# Patient Record
Sex: Female | Born: 1990
Health system: Southern US, Community
[De-identification: ages and names within clinical notes are randomized; demographics above are authoritative.]

## PROBLEM LIST (undated history)

## (undated) DIAGNOSIS — K859 Acute pancreatitis without necrosis or infection, unspecified: Secondary | ICD-10-CM

## (undated) DIAGNOSIS — E079 Disorder of thyroid, unspecified: Secondary | ICD-10-CM

## (undated) HISTORY — PX: TONSILLECTOMY: SUR1361

---

## 2013-10-28 ENCOUNTER — Emergency Department (HOSPITAL_COMMUNITY): Payer: Medicaid - Out of State

## 2013-10-28 ENCOUNTER — Encounter (HOSPITAL_COMMUNITY): Payer: Self-pay | Admitting: Emergency Medicine

## 2013-10-28 ENCOUNTER — Emergency Department (HOSPITAL_COMMUNITY)
Admission: EM | Admit: 2013-10-28 | Discharge: 2013-10-28 | Disposition: A | Discharge status: Home / Self Care | Payer: Medicaid - Out of State | Attending: Emergency Medicine | Admitting: Emergency Medicine

## 2013-10-28 DIAGNOSIS — Z3202 Encounter for pregnancy test, result negative: Secondary | ICD-10-CM | POA: Insufficient documentation

## 2013-10-28 DIAGNOSIS — Z862 Personal history of diseases of the blood and blood-forming organs and certain disorders involving the immune mechanism: Secondary | ICD-10-CM | POA: Insufficient documentation

## 2013-10-28 DIAGNOSIS — Z8719 Personal history of other diseases of the digestive system: Secondary | ICD-10-CM | POA: Insufficient documentation

## 2013-10-28 DIAGNOSIS — Z8639 Personal history of other endocrine, nutritional and metabolic disease: Secondary | ICD-10-CM | POA: Insufficient documentation

## 2013-10-28 DIAGNOSIS — R109 Unspecified abdominal pain: Secondary | ICD-10-CM

## 2013-10-28 DIAGNOSIS — R197 Diarrhea, unspecified: Secondary | ICD-10-CM | POA: Insufficient documentation

## 2013-10-28 DIAGNOSIS — N898 Other specified noninflammatory disorders of vagina: Secondary | ICD-10-CM | POA: Insufficient documentation

## 2013-10-28 DIAGNOSIS — R1084 Generalized abdominal pain: Secondary | ICD-10-CM | POA: Insufficient documentation

## 2013-10-28 DIAGNOSIS — R112 Nausea with vomiting, unspecified: Secondary | ICD-10-CM | POA: Insufficient documentation

## 2013-10-28 HISTORY — DX: Disorder of thyroid, unspecified: E07.9

## 2013-10-28 HISTORY — DX: Acute pancreatitis without necrosis or infection, unspecified: K85.90

## 2013-10-28 LAB — URINALYSIS, ROUTINE W REFLEX MICROSCOPIC
Bilirubin Urine: NEGATIVE
Glucose, UA: NEGATIVE mg/dL
HGB URINE DIPSTICK: NEGATIVE
Ketones, ur: NEGATIVE mg/dL
LEUKOCYTES UA: NEGATIVE
Nitrite: NEGATIVE
PROTEIN: NEGATIVE mg/dL
Specific Gravity, Urine: 1.027 (ref 1.005–1.030)
UROBILINOGEN UA: 0.2 mg/dL (ref 0.0–1.0)
pH: 7 (ref 5.0–8.0)

## 2013-10-28 LAB — COMPREHENSIVE METABOLIC PANEL
ALBUMIN: 4.2 g/dL (ref 3.5–5.2)
ALT: 11 U/L (ref 0–35)
AST: 13 U/L (ref 0–37)
Alkaline Phosphatase: 72 U/L (ref 39–117)
BUN: 13 mg/dL (ref 6–23)
CALCIUM: 9.6 mg/dL (ref 8.4–10.5)
CO2: 23 meq/L (ref 19–32)
Chloride: 103 mEq/L (ref 96–112)
Creatinine, Ser: 0.81 mg/dL (ref 0.50–1.10)
GFR calc Af Amer: >90 mL/min (ref >90)
Glucose, Bld: 101 mg/dL — ABNORMAL HIGH (ref 70–99)
Potassium: 4.2 mEq/L (ref 3.7–5.3)
Sodium: 140 mEq/L (ref 137–147)
Total Bilirubin: 0.3 mg/dL (ref 0.3–1.2)
Total Protein: 8.2 g/dL (ref 6.0–8.3)

## 2013-10-28 LAB — WET PREP, GENITAL
Clue Cells Wet Prep HPF POC: NONE SEEN
Trich, Wet Prep: NONE SEEN
Yeast Wet Prep HPF POC: NONE SEEN

## 2013-10-28 LAB — I-STAT TROPONIN, ED: TROPONIN I, POC: 0 ng/mL (ref 0.00–0.08)

## 2013-10-28 LAB — POC URINE PREG, ED: Preg Test, Ur: NEGATIVE

## 2013-10-28 LAB — CBC
HCT: 41.5 % (ref 36.0–46.0)
Hemoglobin: 14.1 g/dL (ref 12.0–15.0)
MCH: 29.8 pg (ref 26.0–34.0)
MCHC: 34 g/dL (ref 30.0–36.0)
MCV: 87.7 fL (ref 78.0–100.0)
Platelets: 236 10*3/uL (ref 150–400)
RBC: 4.73 MIL/uL (ref 3.87–5.11)
RDW: 13.2 % (ref 11.5–15.5)
WBC: 10.5 10*3/uL (ref 4.0–10.5)

## 2013-10-28 LAB — LIPASE, BLOOD: LIPASE: 74 U/L — AB (ref 11–59)

## 2013-10-28 MED ORDER — IOHEXOL 300 MG/ML  SOLN
20.0000 mL | INTRAMUSCULAR | Status: AC
  Administered 2013-10-28: 20 mL via ORAL

## 2013-10-28 MED ORDER — MORPHINE SULFATE 4 MG/ML IJ SOLN
4.0000 mg | Freq: Once | INTRAMUSCULAR | Status: AC
  Administered 2013-10-28: 4 mg via INTRAVENOUS
  Filled 2013-10-28: qty 1

## 2013-10-28 MED ORDER — OMEPRAZOLE 20 MG PO CPDR: 20.0000 mg | DELAYED_RELEASE_CAPSULE | Freq: Every day | ORAL | Status: DC

## 2013-10-28 MED ORDER — GI COCKTAIL ~~LOC~~: 30.0000 mL | Freq: Once | ORAL | Status: DC

## 2013-10-28 MED ORDER — IOHEXOL 300 MG/ML  SOLN
80.0000 mL | Freq: Once | INTRAMUSCULAR | Status: AC | PRN
  Administered 2013-10-28: 80 mL via INTRAVENOUS

## 2013-10-28 MED ORDER — HYDROCODONE-ACETAMINOPHEN 5-325 MG PO TABS: 1.0000 | ORAL_TABLET | ORAL | Status: DC | PRN

## 2013-10-28 NOTE — ED Notes (Signed)
Gi cocktail not given.   Order not seen in time

## 2013-10-28 NOTE — ED Notes (Signed)
The pt has not heard from her xrays.  She is waiting for a disposition

## 2013-10-28 NOTE — ED Notes (Signed)
Pt reports "moving chest pains" x 3 days. Started in mid chest, moved to back, then left side and now right side. Reports its sharp shooting pain. Feels its similar to pancreatitis pain in past.

## 2013-10-28 NOTE — ED Provider Notes (Signed)
CSN: [id number]     Arrival date & time 10/28/13  1305 History   First MD Initiated Contact with Patient 10/28/13 1521     Chief Complaint  Patient presents with  . Chest Pain  . Back Pain     (Consider location/radiation/quality/duration/timing/severity/associated sxs/prior Treatment) Patient is a [age] y.o. female presenting with chest pain.  Chest Pain Pain location:  Substernal area and epigastric Radiates to: neck, left hip. Pain severity:  Severe Onset quality:  Gradual Duration:  3 days Timing:  Constant Progression:  Worsening Chronicity:  New Relieved by:  Nothing Worsened by:  Nothing tried (not worse with food) Associated symptoms: nausea and vomiting (x 1)   Associated symptoms: no shortness of breath   Associated symptoms comment:  Loose stools x 3 today   Past Medical History  Diagnosis Date  . Pancreatitis   . Thyroid disease    History reviewed. No pertinent past surgical history. History reviewed. No pertinent family history. History  Substance Use Topics  . Smoking status: Current Every Day Smoker    Types: Cigarettes  . Smokeless tobacco: Not on file  . Alcohol Use: No   OB History   Grav Para Term Preterm Abortions TAB SAB Ect Mult Living                 Review of Systems  Respiratory: Negative for shortness of breath.   Cardiovascular: Positive for chest pain.  Gastrointestinal: Positive for nausea and vomiting (x 1).  Genitourinary: Negative for menstrual problem (LMP now).  All other systems reviewed and are negative.     Allergies  Review of patient's allergies indicates no known allergies.  Home Medications   Prior to Admission medications   Medication Sig Start Date End Date Taking? Authorizing Provider  Acetaminophen-Caff-Pyrilamine (MIDOL COMPLETE) 500-60-15 MG TABS Take 1 capsule by mouth daily as needed (for cramping).   Yes Historical Provider, MD   BP 105/66  Pulse 93  Temp(Src) 99.3 F (37.4 C) (Oral)  Resp 22  SpO2  100%  LMP 10/28/2013 Physical Exam  Nursing note and vitals reviewed. Constitutional: She is oriented to person, place, and time. She appears well-developed and well-nourished. No distress.  HENT:  Head: Normocephalic and atraumatic.  Mouth/Throat: Oropharynx is clear and moist.  Eyes: Conjunctivae are normal. Pupils are equal, round, and reactive to light. No scleral icterus.  Neck: Neck supple.  Cardiovascular: Normal rate, regular rhythm, normal heart sounds and intact distal pulses.   No murmur heard. Pulmonary/Chest: Effort normal and breath sounds normal. No stridor. No respiratory distress. She has no rales. She exhibits tenderness (sternal palpation).  Abdominal: Soft. Bowel sounds are normal. She exhibits no distension. There is generalized tenderness (worse in lower abdomen). There is CVA tenderness (mild). There is no rigidity, no rebound and no guarding.  Genitourinary: Uterus is tender. Uterus is not deviated. Cervix exhibits no motion tenderness, no discharge and no friability. Right adnexum displays tenderness. Right adnexum displays no mass. Left adnexum displays tenderness. Left adnexum displays no mass. There is bleeding around the vagina. No tenderness around the vagina. No vaginal discharge found.  nonfocal lower abdominal and pelvic tenderness  Musculoskeletal: Normal range of motion.  Neurological: She is alert and oriented to person, place, and time.  Skin: Skin is warm and dry. No rash noted.  Psychiatric: She has a normal mood and affect. Her behavior is normal.    ED Course  Procedures (including critical care time) Labs Review Labs Reviewed  COMPREHENSIVE METABOLIC  PANEL - Abnormal; Notable for the following:    Glucose, Bld 101 (*)    All other components within normal limits  LIPASE, BLOOD - Abnormal; Notable for the following:    Lipase 74 (*)    All other components within normal limits  CBC  URINALYSIS, ROUTINE W REFLEX MICROSCOPIC  I-STAT TROPOININ,  ED  POC URINE PREG, ED    Imaging Review Dg Chest 2 View  10/28/2013   CLINICAL DATA:  Substernal chest pain radiating to the back  EXAM: CHEST  2 VIEW  COMPARISON:  None.  FINDINGS: The heart size and mediastinal contours are within normal limits. Both lungs are clear. The visualized skeletal structures are unremarkable.  IMPRESSION: No active cardiopulmonary disease.   Electronically Signed   By: Christiana Pellant M.D.   On: 10/28/2013 16:36   Ct Abdomen Pelvis W Contrast  10/28/2013   CLINICAL DATA:  Abdominal pain with nausea and vomiting  EXAM: CT ABDOMEN AND PELVIS WITH CONTRAST  TECHNIQUE: Multidetector CT imaging of the abdomen and pelvis was performed using the standard protocol following bolus administration of intravenous contrast.  CONTRAST:  80mL OMNIPAQUE IOHEXOL 300 MG/ML SOLN intravenously ; the patient also received oral contrast material  COMPARISON:  None.  FINDINGS: The liver exhibits no focal mass nor ductal dilation. The gallbladder is adequately distended. There are no calcified gallstones. The pancreas, spleen, adrenal glands, and kidneys are normal in appearance. The caliber of the abdominal aorta is normal. The periaortic and pericaval regions are normal in appearance.  The stomach is partially distended with the oral contrast. Contrast has traversed the duodenum and jejunum and reached the ileum. There is subjective mild irregularity and thickening of the wall of the transverse or third portion of the duodenum. A definite ulcer is not demonstrated. No diverticulum is demonstrated. Contrast has not yet reached the colon. There is a structure consistent with a normal calibered, uninflamed-appearing appendix on images 42 through 51 of series 201. The stool burden within the colon is within the limits of normal. There is a moderate amount of colonic gas. There are no findings to suggest colitis.  The partially distended urinary bladder is normal in appearance. A tampon is present within  the vagina. The uterus exhibits no acute abnormality. No adnexal masses are demonstrated. There is no free abdominal or pelvic fluid. There is no inguinal nor umbilical hernia. The psoas musculature is normal in appearance.  The lumbar spine and bony pelvis exhibit no acute abnormalities. The lung bases are clear.  IMPRESSION: 1. There is no evidence of bowel obstruction or ileus. Irregularity of the wall of the third portion of the duodenum could reflect duodenitis. There is no abnormality of the jejunum, ileum, or colon. There is no evidence of acute appendicitis. 2. There is no evidence of acute hepatobiliary abnormality. No gallstones are demonstrated. 3. There is no acute urinary tract or gynecological abnormality demonstrated.   Electronically Signed   By: David  [place]   On: 10/28/2013 20:18  All radiology studies independently viewed by me.      EKG Interpretation   Date/Time:  Saturday Oct 28 2013 13:11:31 EDT Ventricular Rate:  92 PR Interval:  122 QRS Duration: 78 QT Interval:  332 QTC Calculation: 410 R Axis:   81 Text Interpretation:  Normal sinus rhythm with sinus arrhythmia Septal  infarct , age undetermined Abnormal ECG No old tracing to compare  Confirmed by [facility]  MD, TREY ([id number]) on 10/28/2013 3:32:45 PM  MDM   Final diagnoses:  Abdominal pain    23 yo female with 3 days of chest and abdominal pain.  Vomiting x 1 and loose stools x 3.  Well appearing.  Abd soft but diffusely tender, worse in lower abdomen. Pain improved with morphine and subsequently her symptoms seemed to localize further to her abdomen (as opposed to chest).  CT obtained and demonstrated mild duodenitis.  Symptoms most likely secondary to gastritis (especially given concurrent chest pain), advised treatment with prilosec.  Lower abdominal tenderness likely secondary to menstrual cycle.  Clinical picture not consistent with ovarian torsion, PID, TOA, ACS, PE, or dissection.  Plan dc home with PPI and  PCP follow up.  Return precautions given.     Candyce ChurnJohn David Edmund Holcomb III, MD 10/29/13 [phone]

## 2013-10-28 NOTE — ED Notes (Signed)
The pt returned from c-t.  Alert no distress 

## 2013-10-28 NOTE — ED Notes (Signed)
Pt  Alert  Skin warm and dry.  Family at the bedside

## 2013-10-28 NOTE — ED Notes (Signed)
To ct

## 2013-10-28 NOTE — Discharge Instructions (Signed)
Abdominal Pain, Women °Abdominal (stomach, pelvic, or belly) pain can be caused by many things. It is important to tell your doctor: °· The location of the pain. °· Does it come and go or is it present all the time? °· Are there things that start the pain (eating certain foods, exercise)? °· Are there other symptoms associated with the pain (fever, nausea, vomiting, diarrhea)? °All of this is helpful to know when trying to find the cause of the pain. °CAUSES  °· Stomach: virus or bacteria infection, or ulcer. °· Intestine: appendicitis (inflamed appendix), regional ileitis (Crohn's disease), ulcerative colitis (inflamed colon), irritable bowel syndrome, diverticulitis (inflamed diverticulum of the colon), or cancer of the stomach or intestine. °· Gallbladder disease or stones in the gallbladder. °· Kidney disease, kidney stones, or infection. °· Pancreas infection or cancer. °· Fibromyalgia (pain disorder). °· Diseases of the female organs: °· Uterus: fibroid (non-cancerous) tumors or infection. °· Fallopian tubes: infection or tubal pregnancy. °· Ovary: cysts or tumors. °· Pelvic adhesions (scar tissue). °· Endometriosis (uterus lining tissue growing in the pelvis and on the pelvic organs). °· Pelvic congestion syndrome (female organs filling up with blood just before the menstrual period). °· Pain with the menstrual period. °· Pain with ovulation (producing an egg). °· Pain with an IUD (intrauterine device, birth control) in the uterus. °· Cancer of the female organs. °· Functional pain (pain not caused by a disease, may improve without treatment). °· Psychological pain. °· Depression. °DIAGNOSIS  °Your doctor will decide the seriousness of your pain by doing an examination. °· Blood tests. °· X-rays. °· Ultrasound. °· CT scan (computed tomography, special type of X-ray). °· MRI (magnetic resonance imaging). °· Cultures, for infection. °· Barium enema (dye inserted in the large intestine, to better view it with  X-rays). °· Colonoscopy (looking in intestine with a lighted tube). °· Laparoscopy (minor surgery, looking in abdomen with a lighted tube). °· Major abdominal exploratory surgery (looking in abdomen with a large incision). °TREATMENT  °The treatment will depend on the cause of the pain.  °· Many cases can be observed and treated at home. °· Over-the-counter medicines recommended by your caregiver. °· Prescription medicine. °· Antibiotics, for infection. °· Birth control pills, for painful periods or for ovulation pain. °· Hormone treatment, for endometriosis. °· Nerve blocking injections. °· Physical therapy. °· Antidepressants. °· Counseling with a psychologist or psychiatrist. °· Minor or major surgery. °HOME CARE INSTRUCTIONS  °· Do not take laxatives, unless directed by your caregiver. °· Take over-the-counter pain medicine only if ordered by your caregiver. Do not take aspirin because it can cause an upset stomach or bleeding. °· Try a clear liquid diet (broth or water) as ordered by your caregiver. Slowly move to a bland diet, as tolerated, if the pain is related to the stomach or intestine. °· Have a thermometer and take your temperature several times a day, and record it. °· Bed rest and sleep, if it helps the pain. °· Avoid sexual intercourse, if it causes pain. °· Avoid stressful situations. °· Keep your follow-up appointments and tests, as your caregiver orders. °· If the pain does not go away with medicine or surgery, you may try: °· Acupuncture. °· Relaxation exercises (yoga, meditation). °· Group therapy. °· Counseling. °SEEK MEDICAL CARE IF:  °· You notice certain foods cause stomach pain. °· Your home care treatment is not helping your pain. °· You need stronger pain medicine. °· You want your IUD removed. °· You feel faint or   lightheaded. °· You develop nausea and vomiting. °· You develop a rash. °· You are having side effects or an allergy to your medicine. °SEEK IMMEDIATE MEDICAL CARE IF:  °· Your  pain does not go away or gets worse. °· You have a fever. °· Your pain is felt only in portions of the abdomen. The right side could possibly be appendicitis. The left lower portion of the abdomen could be colitis or diverticulitis. °· You are passing blood in your stools (bright red or black tarry stools, with or without vomiting). °· You have blood in your urine. °· You develop chills, with or without a fever. °· You pass out. °MAKE SURE YOU:  °· Understand these instructions. °· Will watch your condition. °· Will get help right away if you are not doing well or get worse. °Document Released: 03/29/2007 Document Revised: 08/24/2011 Document Reviewed: 04/18/2009 °ExitCare® Patient Information ©2014 [employer]. ° °Emergency Department Resource Guide °1) Find a Doctor and Pay Out of Pocket °Although you won't have to find out who is covered by your insurance plan, it is a good idea to ask around and get recommendations. You will then need to call the office and see if the doctor you have chosen will accept you as a new patient and what types of options they offer for patients who are self-pay. Some doctors offer discounts or will set up payment plans for their patients who do not have insurance, but you will need to ask so you aren't surprised when you get to your appointment. ° °2) Contact Your Local Health Department °Not all health departments have doctors that can see patients for sick visits, but many do, so it is worth a call to see if yours does. If you don't know where your local health department is, you can check in your phone book. The CDC also has a tool to help you locate your state's health department, and many state websites also have listings of all of their local health departments. ° °3) Find a Walk-in Clinic °If your illness is not likely to be very severe or complicated, you may want to try a walk in clinic. These are popping up all over the country in pharmacies, drugstores, and shopping  centers. They're usually staffed by nurse practitioners or physician assistants that have been trained to treat common illnesses and complaints. They're usually fairly quick and inexpensive. However, if you have serious medical issues or chronic medical problems, these are probably not your best option. ° °No Primary Care Doctor: °- Call [facility] at  832-8000 - they can help you locate a primary care doctor that  accepts your insurance, provides certain services, etc. °- Physician Referral Service- 1-800-533-3463 ° °Chronic Pain Problems: °Organization         Address  Phone   Notes  °[facility]  (336) 297-2271 Patients need to be referred by their primary care doctor.  ° °Medication Assistance: °Organization         Address  Phone   Notes  °[place] [facility] [address] °[place], [place] [address] [address] (336) 641-8030 --Must be a resident of [place] °-- Must have NO insurance coverage whatsoever (no Medicaid/ Medicare, etc.) °-- The pt. MUST have a primary care doctor that directs their care regularly and follows them in the community °  °[facility]  (866) 331-1348   °[employer]  (888) 892-1162   ° °Agencies that provide inexpensive medical care: °Organization           Address  Phone   Notes  °[facility]  (336) 832-8035   °[facility]    (336) 832-7272   °[facility] [address] °[place], [place] [address] (336) 832-4777   °[facility] [address], °[place] (336) 271-4999   °[facility]    (336) 373-0678   °[facility]    (336) 272-1050   °[facility] ° [address], [place] Phone:  (336) 832-4444, Fax:  (336) 832-4440 Hours of Operation:  9 am - 6 pm, M-F.  Also accepts Medicaid/Medicare and self-pay.  °[facility] ° [address], [place] Phone: (336) 832-3150, Fax: (336)  832-3151. Hours of Operation:  8:30 am - 5:30 pm, M-F.  Also accepts Medicaid and self-pay.  °[facility] [place] [address], [place] Phone: (336) 878-6027   °[facility] [address], [place], [place] (336)723-1848, Ext. 123 Mondays & Thursdays: 7-9 AM.  First 15 patients are seen on a first come, first serve basis. °  ° °Medicaid-accepting [place] Providers: ° °Organization         Address  Phone   Notes  °[facility] [address], [place] (336) 641-2100 Also accepts self-pay patients.  °[facility] [address], [place] ° (336) 856-9996   °[facility] [address], [place] (336) 288-8857   °[facility] [address] [place] [address], [place] (336) 299-7000   °Veita Bland [address], [place]  ° (336) [phone] Only accepts [place] Access Medicaid patients after they have their name applied to their card.  ° °Self-Pay (no insurance) in [place]: ° °Organization         Address  Phone   Notes  °Sickle Cell Patients, [facility] [address], [place] (336) 832-1970   °[facility] [address], [place] (336) 832-4400   °[facility] [place] [facility] ° [address], [place] (336) 992-4800   °[facility]/Dr. Osei-Bonsu ° [address] [place] [address], [place] or [address], [place] (336) [phone] Phone number for both [place] and [place] locations is the same.  °[facility] [address], [place] (336) 299-0000   °[facility] [address] [place] [address], [place] or [address] (336) 852-7530 °(336) 878-2260   °[facility] [address], [place] (336) 350-1642, phone; (336) 294-5005, fax Sees patients 1st and 3rd Saturday of every month.  Must not qualify for public or private insurance (i.e.  Medicaid, Medicare, [place] Health Choice, Veterans' Benefits) • Household income should be no more than 200% of the poverty level •The clinic cannot treat you if you are pregnant or think you are pregnant • Sexually transmitted diseases are not treated at the clinic.  ° ° °Dental Care: °Organization         Address  Phone  Notes  °[facility] [address],  (336) 641-6152 Accepts children up to age 21 who are enrolled in Medicaid or [place] Health Choice; pregnant women with a Medicaid card; and children who have applied for Medicaid or [place] Health Choice, but were declined, whose parents can pay a reduced fee at time   of service.  °[facility] [place]  [address], [place] (336) 641-7733 Accepts children up to age 21 who are enrolled in Medicaid or [place] Health Choice; pregnant women with a Medicaid card; and children who have applied for Medicaid or [place] Health Choice, but were declined, whose parents can pay a reduced fee at time of service.  °[facility] ° [address], [place] (336) 641-4533 Patients are seen by appointment only. Walk-ins are not accepted. [facility] will see patients 18 years of age and older. °Monday - Tuesday (8am-5pm) °Most Wednesdays (8:30-5pm) °$30 per visit, cash only  °[facility] ° [address], [place] (336) 641-4533 Patients are seen by appointment only. Walk-ins are not accepted. [facility] will see patients 18 years of age and older. °One Wednesday Evening (Monthly: Volunteer Based).  $30 per visit, cash only  °[facility]  (919) [phone] for adults; Children under age 4, call Graduate Pediatric Dentistry at ([phone]) [phone]. Children aged 4-14, please call ([phone]) [phone] to request a pediatric application. ° Dental services are provided in all areas of dental care including fillings,  crowns and bridges, complete and partial dentures, implants, gum treatment, root canals, and extractions. Preventive care is also provided. Treatment is provided to both adults and children. °Patients are selected via a lottery and there is often a waiting list. °  °[facility] [address], °[place] ° (336) 763-8833 [email or web address] °  °[facility] [address], [place], [place] (336)723-1848, Ext. 123 Second and Fourth Thursday of each month, opens at 6:30 AM; Clinic ends at 9 AM.  Patients are seen on a first-come first-served basis, and a limited number are seen during each clinic.  ° °[facility] ° [address], [place], [place] (336) 723-7904   Eligibility Requirements °You must have lived in [place], [place], or [place] counties for at least the last three months. °  You cannot be eligible for state or federal sponsored healthcare insurance, including Veterans Administration, Medicaid, or Medicare. °  You generally cannot be eligible for healthcare insurance through your employer.  °  How to apply: °Eligibility screenings are held every Tuesday and Wednesday afternoon from 1:00 pm until 4:00 pm. You do not need an appointment for the interview!  °[facility] [address], [place], [place] 336-631-2330   °[facility]  336-342-8273   °[facility]  336-703-3100   °[facility]  336-570-6415   ° °Behavioral Health Resources in the Community: °Intensive Outpatient Programs °Organization         Address  Phone  Notes  °[facility] [address], [place], [place] 336-878-6098   °[facility] [address], [place], [place] 336-832-9800   °[facility] [address], [place], [place] ° 336-882-2125   °[facility] [address],  °[place],  1-800-853-5163 or 336-641-4981   °Substance Abuse  Resources °Organization         Address  Phone  Notes  °[facility]  336-882-2125   °[facility]  336-784-9470   °[facility]  336-285-9073   °[facility]  336-845-3988   °Residential & Outpatient Substance Abuse Program  1-800-659-3381   °Psychological Services °Organization         Address    Phone  Notes  °[facility]  336- 832-9600   °[facility]  336- 378-7881   °[facility] [address], [place] 1-800-853-5163 or 336-641-4981   ° °Mobile Crisis Teams °Organization         Address  Phone  Notes  °[facility], Mobile Crisis Care Unit  1-877-626-1772   °Assertive °[facility] ° [address] [place], [place] 336-834-9664   °Sharon DeEsch [address] °[place] 336-554-5454   ° °Self-Help/Support Groups °Organization         Address  Phone             Notes  °[facility] - variety of support groups  336- 373-1402 Call for more information  °Narcotics Anonymous (NA), [facility] [address], °[place]  2 meetings at this location  ° °Residential Treatment Programs °Organization         Address  Phone  Notes  °[facility] [address],    °[place]  1-866-801-8205   °[facility] ° [address], [place], [place] 704-293-8524   °[facility] [address], [place] 336-845-3988 Admissions: 8am-3pm M-F  °[facility] [address]    °[place], [place] 336-841-1104   °[facility] [address], [place], [place] 336-379-7146   °[facility] [address]  °[place], [place] 336-285-9073   °[facility] - Intensive Outpatient [address], [place], [place] 336-852-3033   °[facility] [address]  °[place], [place] 1-877-615-2722 or 336-784-9470   °[facility] [address] [place], [place] 336-227-7417 Accepts Medicaid  °[facility] [address]  ° [place] 1-800-659-3381 Substance Abuse/Addiction Treatment  ° °[place] Behavioral Health Resources °Organization         Address  Phone  Notes  °[facility]  (888) 581-9988   °Julie Brannon, PhD [address] [place], [place]   (336) 349-5553 or (336) 951-0000   ° Behavioral   [address] °[place], [place] (336) 349-4454   °[facility] [address], [place], [place] (336) 342-8316 Insurance/Medicaid/sponsorship through [facility]  °[facility] [address]                                    [place], [place] (336) 342-8316 Therapy/tele-psych/case  °[facility] [address]  ° [place], [place] (336) 349-2233    °Dr. Arfeen  (336) 349-4544   °[facility] [place]  [employer] [place] [facility] 1) [address],  °2) [address], [place] °3)  [address], [place] (336) 349-3220 °(336) 342-7768 ° °(336) 342-8140   °[place] [facility] (336) 342-1394 or (336) 342-3537 (After Hours)    ° ° ° °

## 2013-10-30 LAB — GC/CHLAMYDIA PROBE AMP
CT Probe RNA: NEGATIVE
GC Probe RNA: NEGATIVE

## 2016-06-29 ENCOUNTER — Emergency Department (HOSPITAL_COMMUNITY): Payer: Medicaid Other

## 2016-06-29 ENCOUNTER — Encounter (HOSPITAL_COMMUNITY): Payer: Self-pay

## 2016-06-29 ENCOUNTER — Emergency Department (HOSPITAL_COMMUNITY)
Admission: EM | Admit: 2016-06-29 | Discharge: 2016-06-29 | Disposition: A | Discharge status: Home / Self Care | Payer: Medicaid Other | Attending: Emergency Medicine | Admitting: Emergency Medicine

## 2016-06-29 DIAGNOSIS — R109 Unspecified abdominal pain: Secondary | ICD-10-CM | POA: Diagnosis present

## 2016-06-29 DIAGNOSIS — Z79899 Other long term (current) drug therapy: Secondary | ICD-10-CM | POA: Diagnosis not present

## 2016-06-29 DIAGNOSIS — F17210 Nicotine dependence, cigarettes, uncomplicated: Secondary | ICD-10-CM | POA: Diagnosis not present

## 2016-06-29 DIAGNOSIS — R1012 Left upper quadrant pain: Secondary | ICD-10-CM | POA: Diagnosis not present

## 2016-06-29 LAB — CBC WITH DIFFERENTIAL/PLATELET
BASOS PCT: 1 %
Basophils Absolute: 0.1 10*3/uL (ref 0.0–0.1)
Eosinophils Absolute: 0.3 10*3/uL (ref 0.0–0.7)
Eosinophils Relative: 3 %
HEMATOCRIT: 37.1 % (ref 36.0–46.0)
Hemoglobin: 12.3 g/dL (ref 12.0–15.0)
Lymphocytes Relative: 34 %
Lymphs Abs: 2.7 10*3/uL (ref 0.7–4.0)
MCH: 28 pg (ref 26.0–34.0)
MCHC: 33.2 g/dL (ref 30.0–36.0)
MCV: 84.3 fL (ref 78.0–100.0)
MONO ABS: 0.5 10*3/uL (ref 0.1–1.0)
Monocytes Relative: 7 %
NEUTROS ABS: 4.4 10*3/uL (ref 1.7–7.7)
NEUTROS PCT: 55 %
Platelets: 253 10*3/uL (ref 150–400)
RBC: 4.4 MIL/uL (ref 3.87–5.11)
RDW: 14.5 % (ref 11.5–15.5)
WBC: 8 10*3/uL (ref 4.0–10.5)

## 2016-06-29 LAB — COMPREHENSIVE METABOLIC PANEL
ALT: 20 U/L (ref 14–54)
AST: 19 U/L (ref 15–41)
Albumin: 3.7 g/dL (ref 3.5–5.0)
Alkaline Phosphatase: 47 U/L (ref 38–126)
Anion gap: 8 (ref 5–15)
BILIRUBIN TOTAL: 0.4 mg/dL (ref 0.3–1.2)
BUN: 16 mg/dL (ref 6–20)
CO2: 25 mmol/L (ref 22–32)
Calcium: 8.3 mg/dL — ABNORMAL LOW (ref 8.9–10.3)
Chloride: 104 mmol/L (ref 101–111)
Creatinine, Ser: 0.68 mg/dL (ref 0.44–1.00)
GFR calc Af Amer: >60 mL/min (ref >60)
GFR calc non Af Amer: >60 mL/min (ref >60)
GLUCOSE: 94 mg/dL (ref 65–99)
POTASSIUM: 3.8 mmol/L (ref 3.5–5.1)
Sodium: 137 mmol/L (ref 135–145)
Total Protein: 6.4 g/dL — ABNORMAL LOW (ref 6.5–8.1)

## 2016-06-29 LAB — URINALYSIS, ROUTINE W REFLEX MICROSCOPIC
Bilirubin Urine: NEGATIVE
Glucose, UA: NEGATIVE mg/dL
Hgb urine dipstick: NEGATIVE
KETONES UR: NEGATIVE mg/dL
LEUKOCYTES UA: NEGATIVE
NITRITE: NEGATIVE
PROTEIN: NEGATIVE mg/dL
Specific Gravity, Urine: 1.008 (ref 1.005–1.030)
pH: 7 (ref 5.0–8.0)

## 2016-06-29 LAB — I-STAT BETA HCG BLOOD, ED (MC, WL, AP ONLY): I-stat hCG, quantitative: <5 m[IU]/mL (ref <5)

## 2016-06-29 LAB — LIPASE, BLOOD: Lipase: 28 U/L (ref 11–51)

## 2016-06-29 MED ORDER — PROMETHAZINE HCL 25 MG PO TABS: 25.0000 mg | ORAL_TABLET | Freq: Four times a day (QID) | ORAL | 0 refills | Status: AC | PRN

## 2016-06-29 MED ORDER — IOPAMIDOL (ISOVUE-300) INJECTION 61%
INTRAVENOUS | Status: AC
  Filled 2016-06-29: qty 100

## 2016-06-29 MED ORDER — NICOTINE 21 MG/24HR TD PT24
21.0000 mg | MEDICATED_PATCH | Freq: Once | TRANSDERMAL | Status: DC
  Administered 2016-06-29: 21 mg via TRANSDERMAL
  Filled 2016-06-29: qty 1

## 2016-06-29 MED ORDER — ONDANSETRON HCL 4 MG/2ML IJ SOLN
4.0000 mg | Freq: Once | INTRAMUSCULAR | Status: AC
  Administered 2016-06-29: 4 mg via INTRAVENOUS
  Filled 2016-06-29: qty 2

## 2016-06-29 MED ORDER — FENTANYL CITRATE (PF) 100 MCG/2ML IJ SOLN
25.0000 ug | Freq: Once | INTRAMUSCULAR | Status: AC
  Administered 2016-06-29: 25 ug via INTRAVENOUS
  Filled 2016-06-29: qty 2

## 2016-06-29 MED ORDER — IOPAMIDOL (ISOVUE-300) INJECTION 61%
100.0000 mL | Freq: Once | INTRAVENOUS | Status: AC | PRN
  Administered 2016-06-29: 100 mL via INTRAVENOUS

## 2016-06-29 MED ORDER — KETOROLAC TROMETHAMINE 30 MG/ML IJ SOLN
30.0000 mg | Freq: Once | INTRAMUSCULAR | Status: AC
  Administered 2016-06-29: 30 mg via INTRAVENOUS
  Filled 2016-06-29: qty 1

## 2016-06-29 MED ORDER — NAPROXEN 500 MG PO TABS: 500.0000 mg | ORAL_TABLET | Freq: Two times a day (BID) | ORAL | 0 refills | Status: AC

## 2016-06-29 MED ORDER — SODIUM CHLORIDE 0.9 % IV BOLUS (SEPSIS)
1000.0000 mL | INTRAVENOUS | Status: AC
  Administered 2016-06-29: 1000 mL via INTRAVENOUS

## 2016-06-29 MED ORDER — TRAMADOL HCL 50 MG PO TABS: 50.0000 mg | ORAL_TABLET | Freq: Four times a day (QID) | ORAL | 0 refills | Status: AC | PRN

## 2016-06-29 MED FILL — traMADol HCL 50 MG TABS: 50 | 3 days supply | Qty: 10 | Fill #0

## 2016-06-29 MED FILL — PROMETHAZINE 25 MG TABLET: 25 | 3 days supply | Qty: 12 | Fill #0

## 2016-06-29 MED FILL — NAPROXEN 500 MG TABLET: 500 | 15 days supply | Qty: 30 | Fill #0

## 2016-06-29 NOTE — ED Provider Notes (Signed)
[facility]-EMERGENCY DEPT Provider Note   CSN: [id number] Arrival date & time: 06/29/16  1059    History   Chief Complaint Chief Complaint  Patient presents with  . Abdominal Pain  . Nausea    HPI Melody Smith is a 26 y.o. female.  26 year old female with a history of alcoholic pancreatitis presents to the emergency department for evaluation of left-sided abdominal pain. She states that pain began last night and worsened this morning. She initially thought the pain was secondary to "gas pain". She states that the area "feels swoll". She states that she has aggravation of her pain with ambulation. She describes the pain as stabbing with movement. This radiates to her back, up to her chest, and down to her left hip. She had some mild nausea which was relieved with Zofran given by EMS en route. She has been passing flatus today. Last bowel movement was 2 days ago. No history of fevers, vomiting, melena, hematochezia, urinary symptoms, or vaginal complaints. No medications taken prior to arrival. Last alcoholic beverage was a half a glass of wine one week ago. Abdominal surgical history significant for serious infection.      Past Medical History:  Diagnosis Date  . Pancreatitis   . Thyroid disease     There are no active problems to display for this patient.   Past Surgical History:  Procedure Laterality Date  . CESAREAN SECTION    . TONSILLECTOMY      OB History    No data available       Home Medications    Prior to Admission medications   Medication Sig Start Date End Date Taking? Authorizing Provider  naproxen (NAPROSYN) 500 MG tablet Take 1 tablet (500 mg total) by mouth 2 (two) times daily. 06/29/16   Antony Madura, PA-C  promethazine (PHENERGAN) 25 MG tablet Take 1 tablet (25 mg total) by mouth every 6 (six) hours as needed for nausea or vomiting. 06/29/16   Antony Madura, PA-C  traMADol (ULTRAM) 50 MG tablet Take 1 tablet (50 mg total) by mouth every 6 (six) hours as  needed for severe pain. 06/29/16   Antony Madura, PA-C    Family History History reviewed. No pertinent family history.  Social History Social History  Substance Use Topics  . Smoking status: Current Every Day Smoker    Packs/day: 0.50    Types: Cigarettes  . Smokeless tobacco: Never Used  . Alcohol use No     Allergies   Patient has no known allergies.   Review of Systems Review of Systems Ten systems reviewed and are negative for acute change, except as noted in the HPI.    Physical Exam Updated Vital Signs BP (!) 108/54 (BP Location: Left Arm)   Pulse 70   Temp 97.7 F (36.5 C) (Oral)   Resp 12   Ht 5\' 3"  (1.6 m)   Wt 65.8 kg   LMP 06/18/2016 (Approximate)   SpO2 100%   BMI 25.69 kg/m   Physical Exam  Constitutional: She is oriented to person, place, and time. She appears well-developed and well-nourished. No distress.  Nontoxic appearing and in NAD  HENT:  Head: Normocephalic and atraumatic.  Eyes: Conjunctivae and EOM are normal. No scleral icterus.  Neck: Normal range of motion.  Cardiovascular: Normal rate, regular rhythm and intact distal pulses.   Pulmonary/Chest: Effort normal. No respiratory distress. She has no wheezes.  Respirations even and unlabored  Abdominal: Soft. She exhibits no distension and no mass. There is tenderness.  There is no guarding.  Soft abdomen with minimal tenderness in the left upper and left lower quadrants. No masses, rigidity, or peritoneal signs. No abdominal distention.  Musculoskeletal: Normal range of motion.  Neurological: She is alert and oriented to person, place, and time. She exhibits normal muscle tone. Coordination normal.  GCS 15. Patient moving all extremities.  Skin: Skin is warm and dry. No rash noted. She is not diaphoretic. No erythema. No pallor.  Psychiatric: She has a normal mood and affect. Her behavior is normal.  Nursing note and vitals reviewed.    ED Treatments / Results  Labs (all labs ordered  are listed, but only abnormal results are displayed) Labs Reviewed  COMPREHENSIVE METABOLIC PANEL - Abnormal; Notable for the following:       Result Value   Calcium 8.3 (*)    Total Protein 6.4 (*)    All other components within normal limits  CBC WITH DIFFERENTIAL/PLATELET  LIPASE, BLOOD  URINALYSIS, ROUTINE W REFLEX MICROSCOPIC  I-STAT BETA HCG BLOOD, ED ([facility], [facility], [facility] ONLY)    EKG  EKG Interpretation None       Radiology Ct Abdomen Pelvis W Contrast  Result Date: 06/29/2016 CLINICAL DATA:  Right upper quadrant and right lower quadrant pain. Left upper quadrant pain since last night. Nausea. Frequent bowel movement. EXAM: CT ABDOMEN AND PELVIS WITH CONTRAST TECHNIQUE: Multidetector CT imaging of the abdomen and pelvis was performed using the standard protocol following bolus administration of intravenous contrast. CONTRAST:  100mL ISOVUE-300 IOPAMIDOL (ISOVUE-300) INJECTION 61% COMPARISON:  10/28/2013 FINDINGS: Lower chest: Lung bases are within normal. Hepatobiliary: Within normal. Pancreas: Within normal. Spleen: Within normal. Adrenals/Urinary Tract: Adrenal glands are normal. Kidneys are normal in size without hydronephrosis or nephrolithiasis. Ureters and bladder are within normal. Stomach/Bowel: The stomach and small bowel are within normal. Appendix is normal. Colon is within normal. Vascular/Lymphatic: Vascular structures are within normal. There is no adenopathy. Reproductive: Uterus is retroverted and otherwise within normal. Ovaries are somewhat high in location and otherwise within normal. Small focal fluid collection measuring 1.6 x 3.1 cm immediately inferior to the cecum. Tiny amount of free fluid in the pelvis. Other: None. Musculoskeletal: Remaining bones and soft tissues are within normal. IMPRESSION: Small amount of free fluid in the right lower quadrant and pelvis, otherwise no acute findings. Electronically Signed   By: Elberta Fortisaniel  Boyle M.D.   On: 06/29/2016 15:42     Procedures Procedures (including critical care time)  Medications Ordered in ED Medications  nicotine (NICODERM CQ - dosed in mg/24 hours) patch 21 mg (21 mg Transdermal Patch Applied 06/29/16 1253)  iopamidol (ISOVUE-300) 61 % injection (not administered)  ketorolac (TORADOL) 30 MG/ML injection 30 mg (not administered)  sodium chloride 0.9 % bolus 1,000 mL (0 mLs Intravenous Stopped 06/29/16 1439)  fentaNYL (SUBLIMAZE) injection 25 mcg (25 mcg Intravenous Given 06/29/16 1321)  ondansetron (ZOFRAN) injection 4 mg (4 mg Intravenous Given 06/29/16 1320)  iopamidol (ISOVUE-300) 61 % injection 100 mL (100 mLs Intravenous Contrast Given 06/29/16 1516)     Initial Impression / Assessment and Plan / ED Course  I have reviewed the triage vital signs and the nursing notes.  Pertinent labs & imaging results that were available during my care of the patient were reviewed by me and considered in my medical decision making (see chart for details).  Clinical Course     26 year old female presents to the emergency department for evaluation of abdominal pain which began last night. Symptoms associated  with nausea. There was tenderness on exam in the left upper and left lower quadrants. This remained stable on repeat exam. No associated fevers, vomiting, diarrhea, melena, or hematochezia. No evidence of acute surgical abdomen today. Laboratory workup reassuring. Decision was made to proceed with CT scan when patient continued to express concern over symptoms. This showed some nonspecific fluid in the pelvis. Question ruptured ovarian cyst given radiation of the pain to the patient's left thigh/hip.  The patient has remained comfortable while in the emergency department. Her pain is well-controlled. I do not believe further emergent workup is indicated. Return precautions discussed and provided. Patient discharged in stable condition with no unaddressed concerns.   Final Clinical Impressions(s) / ED  Diagnoses   Final diagnoses:  Left upper quadrant pain    New Prescriptions New Prescriptions   NAPROXEN (NAPROSYN) 500 MG TABLET    Take 1 tablet (500 mg total) by mouth 2 (two) times daily.   PROMETHAZINE (PHENERGAN) 25 MG TABLET    Take 1 tablet (25 mg total) by mouth every 6 (six) hours as needed for nausea or vomiting.   TRAMADOL (ULTRAM) 50 MG TABLET    Take 1 tablet (50 mg total) by mouth every 6 (six) hours as needed for severe pain.     Antony Madura, PA-C 06/29/16 1604    Linwood Dibbles, MD 06/29/16 2142

## 2016-06-29 NOTE — ED Triage Notes (Signed)
Per EMS- Patient c/o abdominal pain and nausea since last night. Patient reports a pancreatitis history. Patient denies vomiting. Patient was given Fentanyl 100 mcg Iv, NS 400 ml, and Zofran 4 mg IV prior to arrival to the ED

## 2016-09-16 ENCOUNTER — Encounter (HOSPITAL_COMMUNITY): Payer: Self-pay | Admitting: Emergency Medicine

## 2016-09-16 ENCOUNTER — Ambulatory Visit (HOSPITAL_COMMUNITY)
Admission: EM | Admit: 2016-09-16 | Discharge: 2016-09-16 | Disposition: A | Discharge status: Home / Self Care | Payer: Medicaid Other | Attending: Internal Medicine | Admitting: Internal Medicine

## 2016-09-16 ENCOUNTER — Ambulatory Visit (INDEPENDENT_AMBULATORY_CARE_PROVIDER_SITE_OTHER): Payer: Medicaid Other

## 2016-09-16 DIAGNOSIS — Z1389 Encounter for screening for other disorder: Secondary | ICD-10-CM | POA: Diagnosis not present

## 2016-09-16 DIAGNOSIS — R141 Gas pain: Secondary | ICD-10-CM

## 2016-09-16 DIAGNOSIS — Z3202 Encounter for pregnancy test, result negative: Secondary | ICD-10-CM | POA: Diagnosis not present

## 2016-09-16 DIAGNOSIS — R109 Unspecified abdominal pain: Secondary | ICD-10-CM | POA: Diagnosis not present

## 2016-09-16 LAB — POCT URINALYSIS DIP (DEVICE)
BILIRUBIN URINE: NEGATIVE
Glucose, UA: NEGATIVE mg/dL
HGB URINE DIPSTICK: NEGATIVE
KETONES UR: NEGATIVE mg/dL
Leukocytes, UA: NEGATIVE
Nitrite: NEGATIVE
PH: 6.5 (ref 5.0–8.0)
Protein, ur: NEGATIVE mg/dL
Specific Gravity, Urine: 1.02 (ref 1.005–1.030)
Urobilinogen, UA: 0.2 mg/dL (ref 0.0–1.0)

## 2016-09-16 LAB — POCT PREGNANCY, URINE: Preg Test, Ur: NEGATIVE

## 2016-09-16 NOTE — ED Triage Notes (Signed)
Today has had abdominal pain, minimal nausea, lightheaded.  Left flank abdominal pain, pain in low abdomen.  Constant pain in side, intermittent and shooting pain in lower abdomen.   Last bm was yesterday and reported as soft and "green" Denies urinary symptoms, but has had frequency

## 2016-09-16 NOTE — Discharge Instructions (Signed)
Recommend using MiraLAX to allow the stool and gas to exit the colon. The diagnosis is not necessarily constipation but she did have a large amount of stool in your: Associated with gas. This is likely causing your pain. Increase her fluid intake and fiber. Read the above instructions which will also assist in helping with constipation and gas like abdominal pains. Any worsening, new symptoms or problems may return or go to the emergency department.

## 2016-09-16 NOTE — ED Provider Notes (Signed)
CSN: [id number]     Arrival date & time 09/16/16  1910 History   None    Chief Complaint  Patient presents with  . Abdominal Pain   (Consider location/radiation/quality/duration/timing/severity/associated sxs/prior Treatment) 26 year old female states that early this morning she developed a hot sensation in her face and around her neck. This eventually abated it was associated with nausea without vomiting. She was unsure if she had a fever or not and it was not measured. Her chief complaint for now his persistent abdominal pain primarily in the left lateral abdomen but radiates across the lower abdomen. She occasionally has a shooting type pain in the left lower quadrant toward the midline. She states yesterday she had a normal bowel movement with a greenish color. No prior abdominal surgeries with the exception of a hysterectomy.      Past Medical History:  Diagnosis Date  . Pancreatitis   . Thyroid disease    Past Surgical History:  Procedure Laterality Date  . CESAREAN SECTION    . TONSILLECTOMY     No family history on file. Social History  Substance Use Topics  . Smoking status: Former Smoker    Packs/day: 0.50    Types: Cigarettes  . Smokeless tobacco: Never Used  . Alcohol use No   OB History    No data available     Review of Systems  Constitutional: Negative.   HENT: Negative.   Respiratory: Negative.   Cardiovascular: Negative.   Gastrointestinal: Positive for abdominal pain and nausea. Negative for abdominal distention, blood in stool, rectal pain and vomiting.  Genitourinary: Negative.   Skin: Negative.   Neurological: Negative.   All other systems reviewed and are negative.   Allergies  Patient has no known allergies.  Home Medications   Prior to Admission medications   Medication Sig Start Date End Date Taking? Authorizing Provider  naproxen (NAPROSYN) 500 MG tablet Take 1 tablet (500 mg total) by mouth 2 (two) times daily. 06/29/16   Antony Madura,  PA-C  promethazine (PHENERGAN) 25 MG tablet Take 1 tablet (25 mg total) by mouth every 6 (six) hours as needed for nausea or vomiting. 06/29/16   Antony Madura, PA-C  traMADol (ULTRAM) 50 MG tablet Take 1 tablet (50 mg total) by mouth every 6 (six) hours as needed for severe pain. 06/29/16   Antony Madura, PA-C   Meds Ordered and Administered this Visit  Medications - No data to display  BP 108/72 (BP Location: Right Arm)   Pulse 89   Temp 97.4 F (36.3 C) (Oral)   Resp 16   LMP 09/04/2016 (Exact Date)   SpO2 99%  No data found.   Physical Exam  Constitutional: She appears well-developed and well-nourished. No distress.  Eyes: EOM are normal.  Neck: Neck supple.  Cardiovascular: Normal rate, regular rhythm, normal heart sounds and intact distal pulses.   Pulmonary/Chest: Effort normal and breath sounds normal. No respiratory distress. She has no wheezes. She has no rales.  Abdominal: Bowel sounds are normal. She exhibits no distension and no mass. There is tenderness. There is no rebound and no guarding.  Tenderness to the left lateral most abdomen, tenderness to the left lower quadrant, lower mid and right abdomen. Palpation of the lower most abdomen produces pain in the epigastrium and upper abdomen. No epigastric or right upper quadrant tenderness. Negative Murphy's. No McBurney's tenderness.  Musculoskeletal: Normal range of motion. She exhibits no edema or deformity.  Neurological: She is alert.  Skin: Skin is  warm and dry.  Psychiatric: She has a normal mood and affect.  Nursing note and vitals reviewed.   Urgent Care Course     Procedures (including critical care time)  Labs Review Labs Reviewed  POCT URINALYSIS DIP (DEVICE)  POCT PREGNANCY, URINE   Results for orders placed or performed during the hospital encounter of 09/16/16  POCT urinalysis dip (device)  Result Value Ref Range   Glucose, UA NEGATIVE NEGATIVE mg/dL   Bilirubin Urine NEGATIVE NEGATIVE   Ketones, ur  NEGATIVE NEGATIVE mg/dL   Specific Gravity, Urine 1.020 1.005 - 1.030   Hgb urine dipstick NEGATIVE NEGATIVE   pH 6.5 5.0 - 8.0   Protein, ur NEGATIVE NEGATIVE mg/dL   Urobilinogen, UA 0.2 0.0 - 1.0 mg/dL   Nitrite NEGATIVE NEGATIVE   Leukocytes, UA NEGATIVE NEGATIVE  Pregnancy, urine POC  Result Value Ref Range   Preg Test, Ur NEGATIVE NEGATIVE    Imaging Review Dg Abd 1 View  Result Date: 09/16/2016 CLINICAL DATA:  26 year old acute onset of left lower quadrant and periumbilical abdominal pain which began this afternoon. Personal history pancreatitis. EXAM: ABDOMEN - 1 VIEW COMPARISON:  CT abdomen pelvis 06/29/2016, 10/28/2013. FINDINGS: Bowel gas pattern unremarkable without evidence of obstruction or significant ileus. Moderate to large stool burden in the colon. No visible opaque urinary tract calculi. Phlebolith low in the left side of the pelvis. Regional skeleton intact. IMPRESSION: No acute abdominal abnormality. Moderate to large colonic stool burden. Electronically Signed   By: Hulan Saas M.D.   On: 09/16/2016 20:39     Visual Acuity Review  Right Eye Distance:   Left Eye Distance:   Bilateral Distance:    Right Eye Near:   Left Eye Near:    Bilateral Near:         MDM   1. Abdominal gas pain    Recommend using MiraLAX to allow the stool and gas to exit the colon. The diagnosis is not necessarily constipation but she did have a large amount of stool in your: Associated with gas. This is likely causing your pain. Increase her fluid intake and fiber. Read the above instructions which will also assist in helping with constipation and gas like abdominal pains. Any worsening, new symptoms or problems may return or go to the emergency department.     Hayden Rasmussen, NP 09/16/16 2049

## 2017-07-22 IMAGING — CT CT ABD-PELV W/ CM
2 of 4 series · 16 of 46 positions shown, 18 images · IV contrast (ISOVUE)
Comparison: 10/28/2013

CLINICAL DATA: Right upper quadrant and right lower quadrant pain.
Left upper quadrant pain since last night. Nausea. Frequent bowel
movement.

EXAM:
CT ABDOMEN AND PELVIS WITH CONTRAST
TECHNIQUE: Multidetector CT imaging of the abdomen and pelvis was performed
using the standard protocol following bolus administration of
intravenous contrast.
CONTRAST:  100mL MMXT0F-3MM IOPAMIDOL (MMXT0F-3MM) INJECTION 61%

[Series 2: abd/pel with · axial · 0.74mm/px · z∈[-428,+2]mm · 13 of 96 slices shown, 15 images]
[im 5/96  soft-tissue]
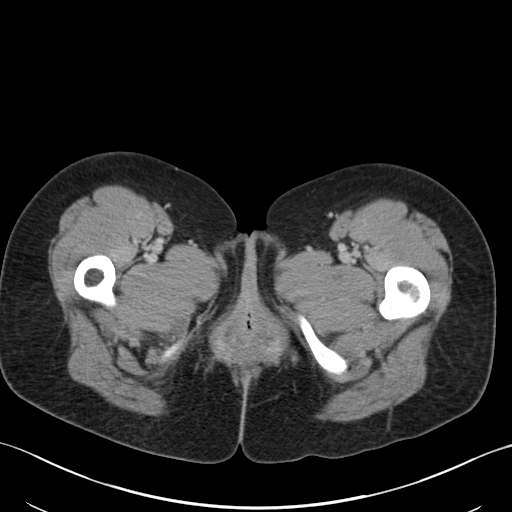
[im 5/96  bone]
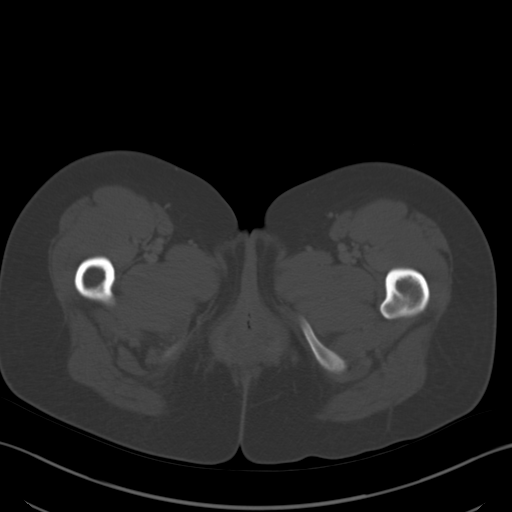
[im 15/96  soft-tissue]
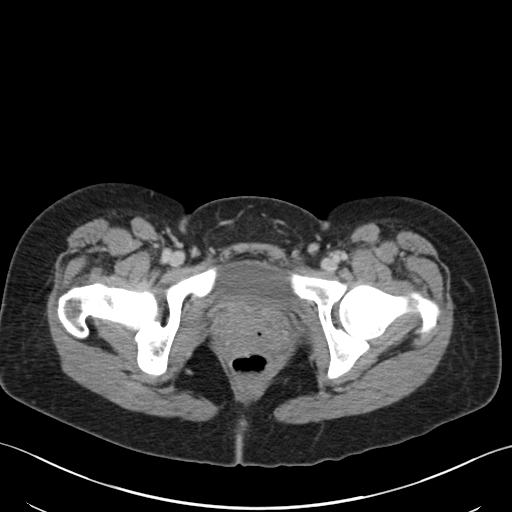
[im 20/96  soft-tissue]
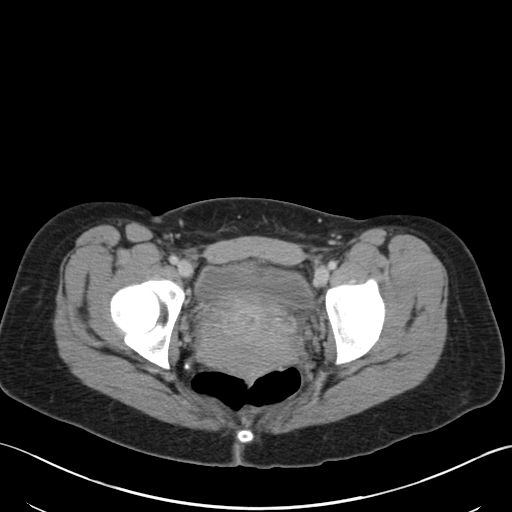
[im 29/96  soft-tissue]
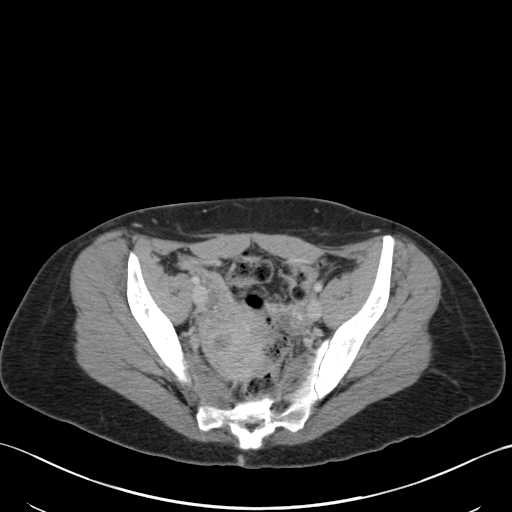
[im 34/96  soft-tissue]
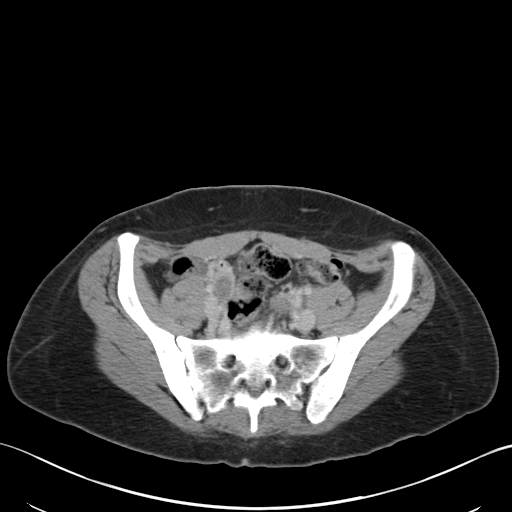
[im 43/96  soft-tissue]
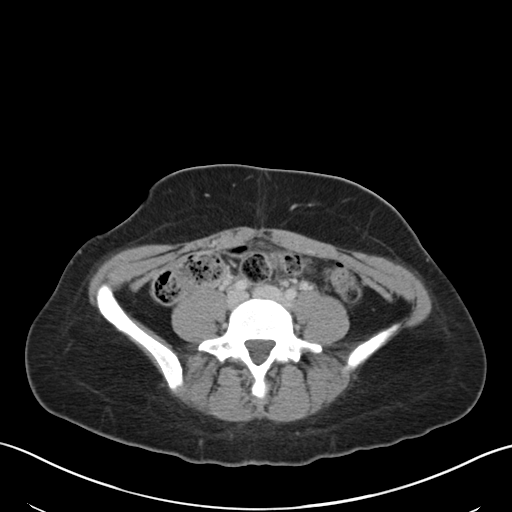
[im 48/96  soft-tissue]
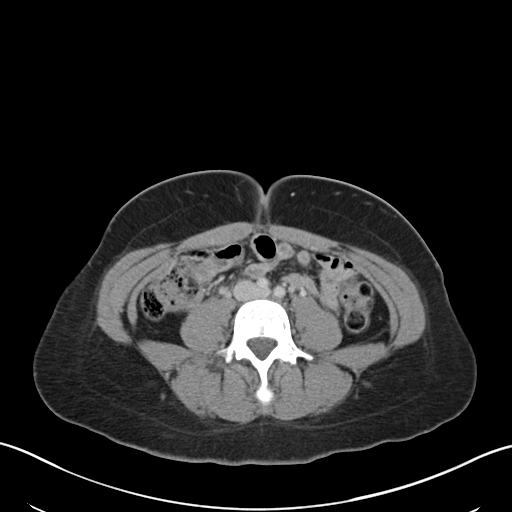
[im 53/96  soft-tissue]
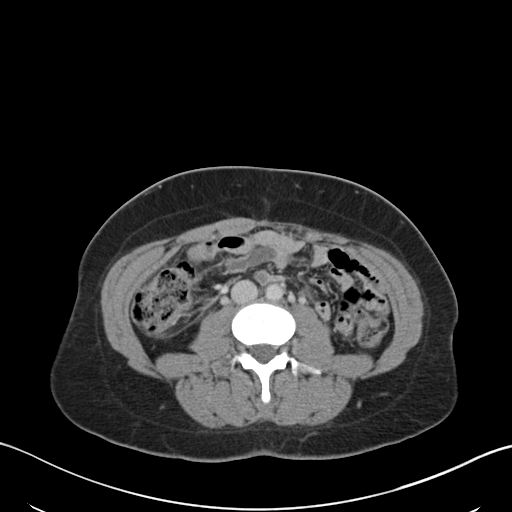
[im 62/96  soft-tissue]
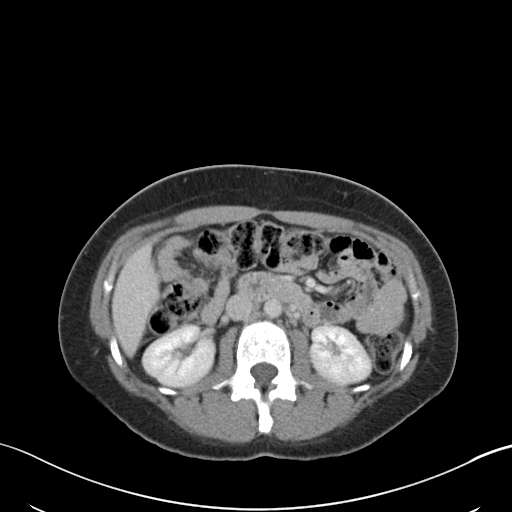
[im 62/96  bone]
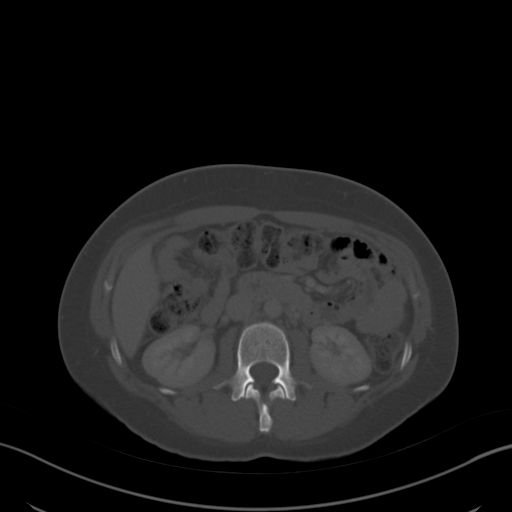
[im 67/96  soft-tissue]
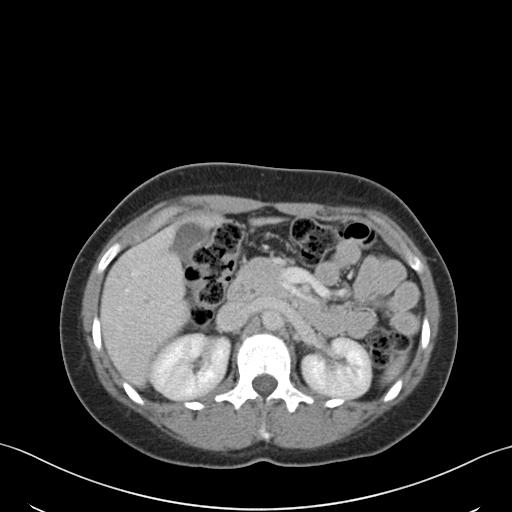
[im 77/96  soft-tissue]
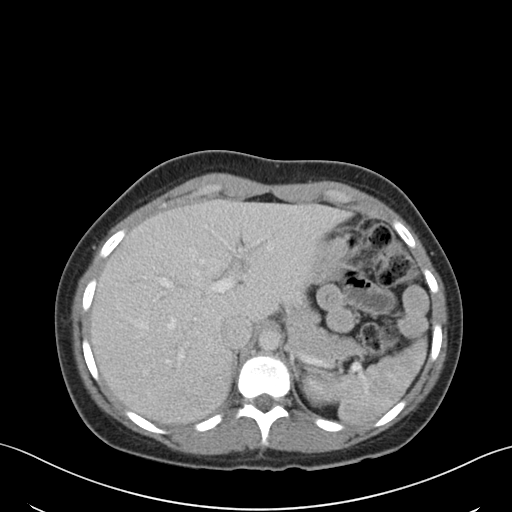
[im 81/96  soft-tissue]
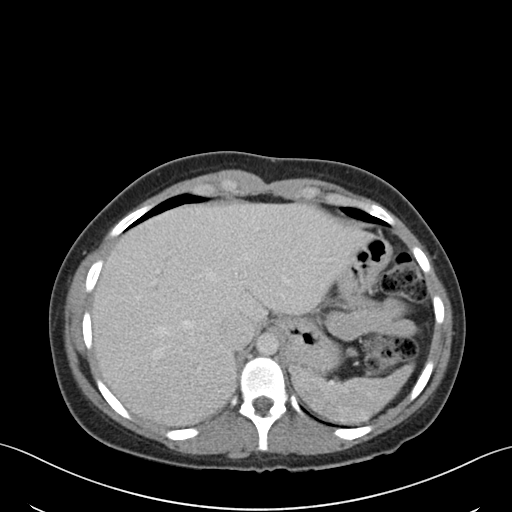
[im 91/96  soft-tissue]
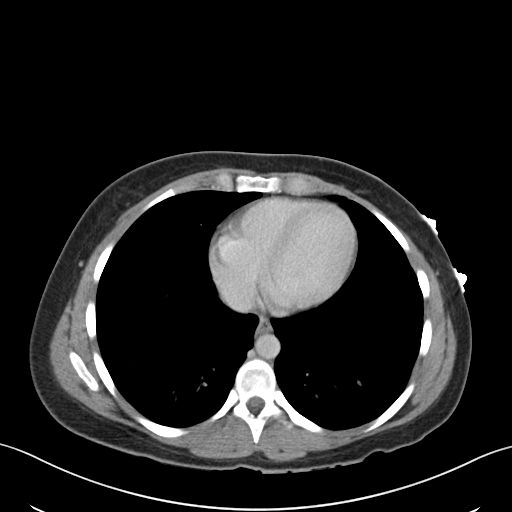

[Series 3: coronal a/|p · coronal · 0.74mm/px · 3 of 118 slices shown]
[im 40/118  soft-tissue]
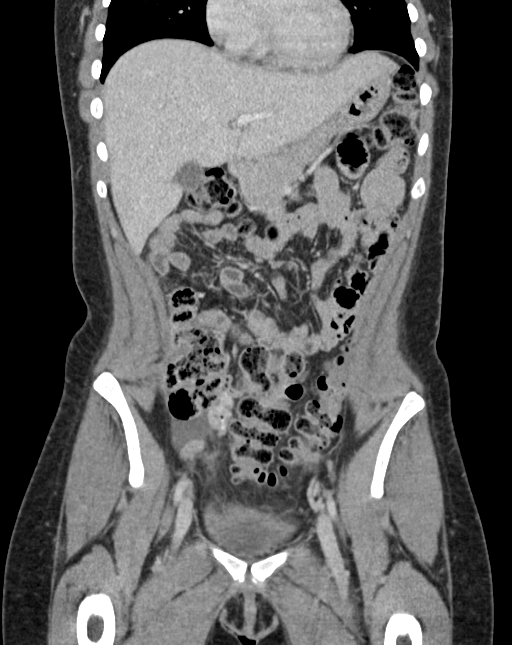
[im 53/118  soft-tissue]
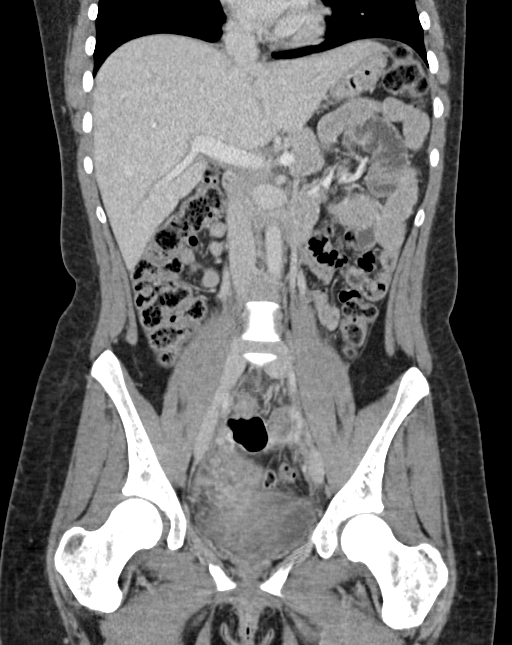
[im 66/118  soft-tissue]
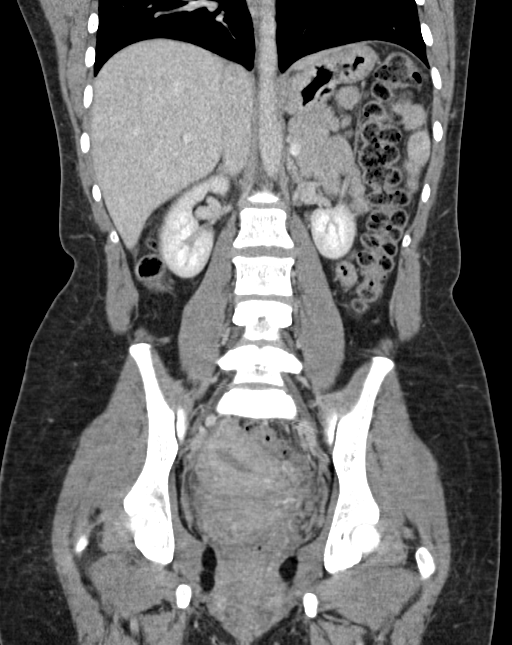

[16 of 46 positions shown; findings below may reference images not displayed]

FINDINGS: Lower chest: Lung bases are within normal.

Hepatobiliary: Within normal.

Pancreas: Within normal.

Spleen: Within normal.

Adrenals/Urinary Tract: Adrenal glands are normal. Kidneys are
normal in size without hydronephrosis or nephrolithiasis. Ureters
and bladder are within normal.

Stomach/Bowel: The stomach and small bowel are within normal.
Appendix is normal. Colon is within normal.

Vascular/Lymphatic: Vascular structures are within normal. There is
no adenopathy.

Reproductive: Uterus is retroverted and otherwise within normal.
Ovaries are somewhat high in location and otherwise within normal.
Small focal fluid collection measuring 1.6 x 3.1 cm immediately
inferior to the cecum. Tiny amount of free fluid in the pelvis.

Other: None.

Musculoskeletal: Remaining bones and soft tissues are within normal.
IMPRESSION: Small amount of free fluid in the right lower quadrant and pelvis,
otherwise no acute findings.

## 2017-10-09 IMAGING — DX DG ABDOMEN 1V
1 series · 1 of 1 positions shown · non-contrast
Comparison: CT abdomen pelvis 06/29/2016, 10/28/2013.

CLINICAL DATA: 25-year-old acute onset of left lower quadrant and
periumbilical abdominal pain which began this afternoon. Personal
history pancreatitis.

EXAM:
ABDOMEN - 1 VIEW

[abdomen kub]
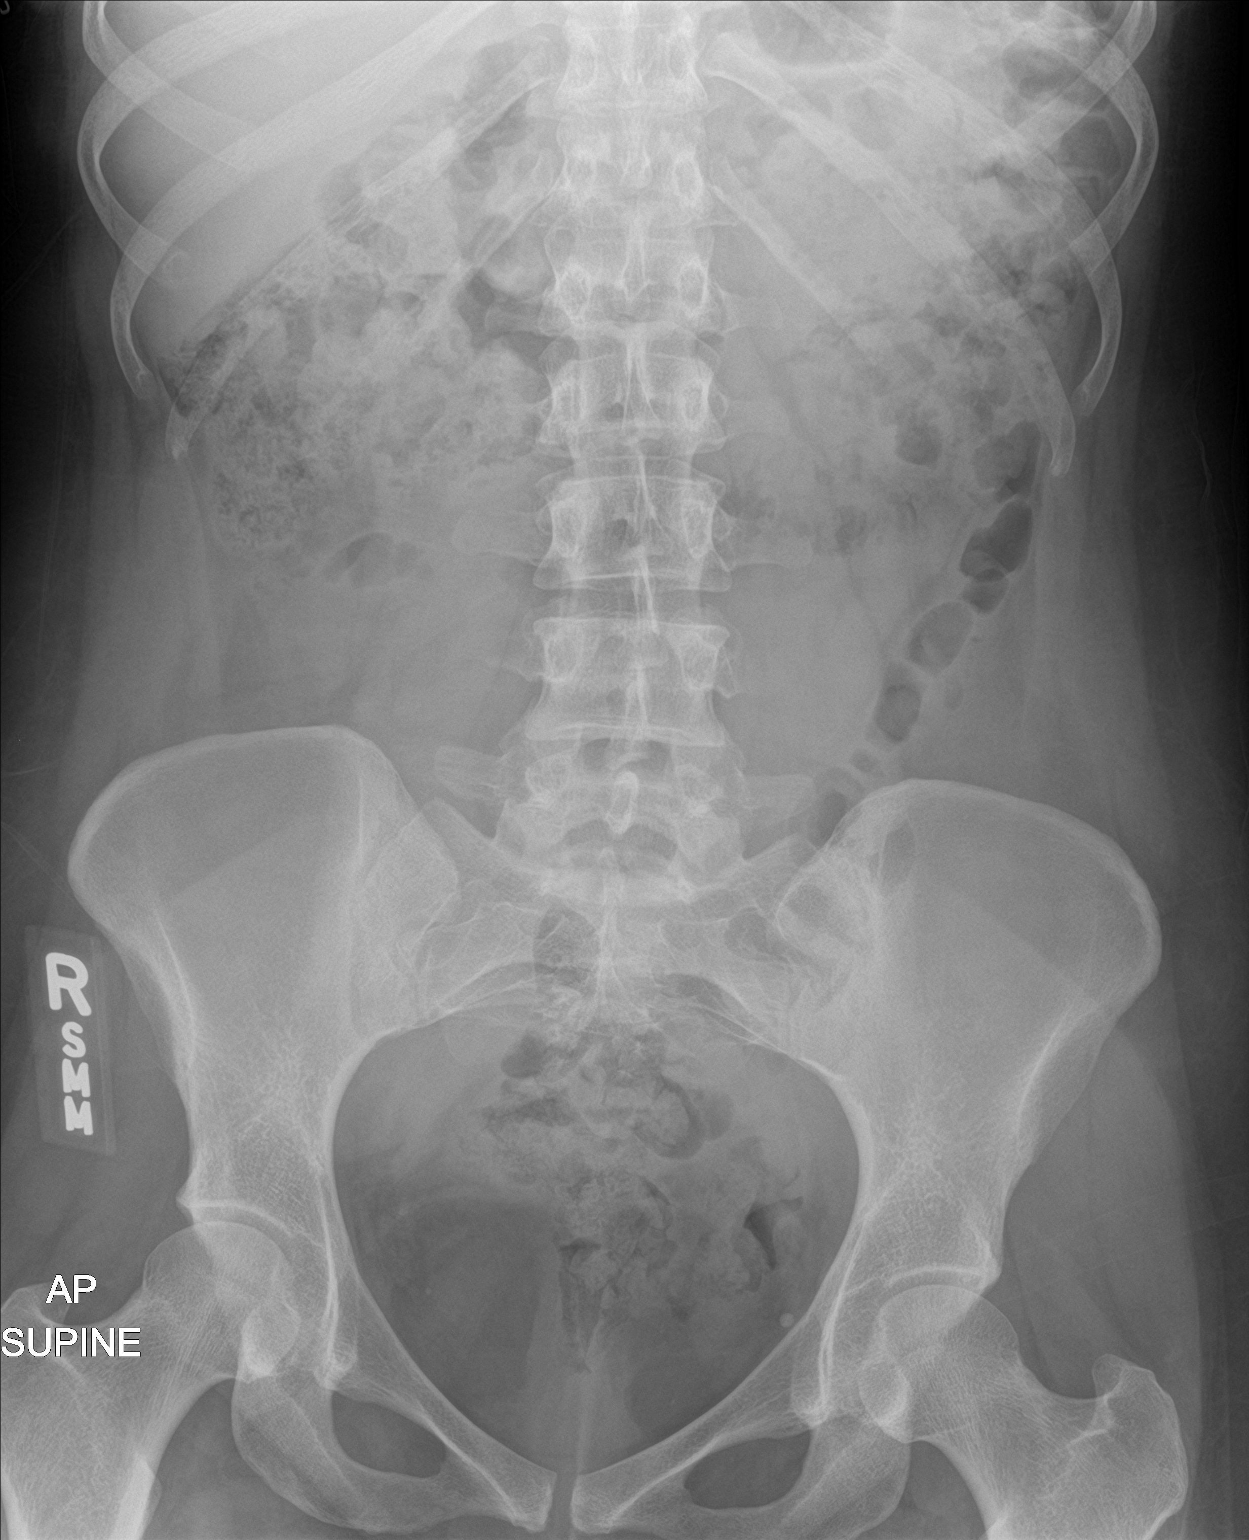

[1 of 1 positions shown; findings below may reference images not displayed]

FINDINGS: Bowel gas pattern unremarkable without evidence of obstruction or
significant ileus. Moderate to large stool burden in the colon. No
visible opaque urinary tract calculi. Phlebolith low in the left
side of the pelvis. Regional skeleton intact.
IMPRESSION: No acute abdominal abnormality. Moderate to large colonic stool
burden.

## 2020-01-23 NOTE — Progress Notes (Signed)
° °  Subjective:  The patient is a 29 y.o. for a TB skin test.  Indication for TB screening: pre-emplyoment  Previous vacccination with BCG?: No  Contraindications for TB screening: none  Have you ever fainted, nearly fainted, or been concerned about fainting after receiving an injection/vaccine?: No  Objective:   Assessment/Plan:  1. Screening examination for pulmonary tuberculosis  - TB Skin Test  PPD placed per [facility] Guidelines.  Patient Instructed to return in 48-72 hours for PPD reading.

## 2020-01-25 NOTE — Progress Notes (Signed)
°  Subjective:  Patient ID:  The patient is a 29 y.o. for a TB skin test.  HIV Infection?: No  Recent contact with person who has active TB?: No  Fibrotic changes on chest x-ray constistent with prior TB?: No  Organ transplant in the past?: No  Immunosuppression?: No  Recent arrival (<5 years) from high prevalence country?: No  Previous vacccination with BCG?: No  IV drug user?: No  Resident or employee of high risk congregate setting?: No  Mycobacterial lab personnel?: No  High risk clinical conditions?: No  Is the patent a child <23 years of age?: No  Is the patient an infant, child or adolescent exposed to adults in high risk categories?: No  Patient tested in [place] ?: No  Objective:   Assessment/Plan:  1. Screening for tuberculosis  - Read PPD - negative  PPD read completed.  Results and recommendations reviewed with patient in accordance with [facility] Guidelines.

## 2024-04-19 NOTE — Progress Notes (Signed)
 Subjective:     Patient ID: Melody Smith is a 33 y.o.  Dysuria  This is a new problem. The current episode started in the past 7 days. The problem occurs every urination. The problem has been unchanged. The quality of the pain is described as burning. The pain is mild. There has been no fever. She is Sexually active. There is No history of pyelonephritis. Associated symptoms include a discharge, frequency and urgency. Pertinent negatives include no chills, flank pain, hematuria, hesitancy, nausea or vomiting. She has tried nothing for the symptoms. There is no history of kidney stones or recurrent UTIs.  Vaginal Discharge The patient's primary symptoms include a genital odor and vaginal discharge. The patient's pertinent negatives include no genital itching, genital lesions, genital rash, missed menses, pelvic pain or vaginal bleeding. This is a new problem. The current episode started in the past 7 days. The problem occurs intermittently. The problem has been waxing and waning. The patient is experiencing no pain. She is not pregnant. Associated symptoms include dysuria, frequency, painful intercourse (not sexually active for months and then had intercourse with new partner w/o condoms a few days ago and it was uncomfortable) and urgency. Pertinent negatives include no abdominal pain, back pain, chills, constipation, diarrhea, discolored urine, fever, flank pain, headaches, hematuria, nausea or vomiting. The vaginal discharge was clear. There has been no bleeding. The symptoms are aggravated by intercourse. She has tried nothing for the symptoms. She is sexually active. It is unknown whether or not her partner has an STD. She uses nothing for contraception. Her menstrual history has been regular.    The following portions of the patient's history were reviewed and updated as appropriate.  Past Medical History:  has no past medical history on file.  Problem List: does not have a problem list on  file.  Past Surgical History:  has a past surgical history that includes Cesarean section.  Family History: family history is not on file.  Social History:  reports that she has been smoking cigarettes. She has never used smokeless tobacco. She reports that she does not currently use alcohol. She reports that she does not use drugs.  Current Medications: has a current medication list which includes the following prescription(s): bupropion and nitrofurantoin (macrocrystal-monohydrate).  Prior to encounter Medications:  Current Outpatient Medications on File Prior to Visit  Medication Sig Dispense Refill   buPROPion (WELLBUTRIN SR) 150 MG SR tablet TAKE 1 (ONE) TABLET (150 MG TOTAL) BY MOUTH IN THE MORNING AND 1 (ONE) TABLET (150 MG TOTAL) IN THE     No current facility-administered medications on file prior to visit.    Allergies: has no known allergies.  Review of Systems  Constitutional:  Negative for chills and fever.  Gastrointestinal:  Negative for abdominal pain, constipation, diarrhea, nausea and vomiting.  Genitourinary:  Positive for dysuria, frequency, urgency and vaginal discharge. Negative for flank pain, hematuria, hesitancy, missed menses and pelvic pain.  Musculoskeletal:  Negative for back pain.  Neurological:  Negative for headaches.       Objective:   BP 122/77   Pulse 75   Temp 37.1 C (98.7 F) (Oral)   Ht 160 cm (5' 3)   Wt 75.2 kg (165 lb 12.8 oz)   LMP 04/04/2024 (Exact Date)   SpO2 99%   BMI 29.37 kg/m    Physical Exam  Results for orders placed or performed in visit on 04/19/24  Urinalysis w/Microscopic  Result Value Ref Range   Color Yellow Colorless,  Straw, Light Yellow, Yellow, Dark Yellow   Clarity Cloudy (!) Clear   Specific Gravity 1.021 1.005 - 1.030   pH, Urine 7.5 5.0 - 8.0   Protein, Urinalysis Negative Negative mg/dL   Glucose, Urinalysis Negative Negative mg/dL   Ketones, Urinalysis Negative Negative mg/dL   Blood, Urinalysis  Negative Negative   Nitrite, Urinalysis Negative Negative   Leukocyte Esterase, Urinalysis Trace (!) Negative   Bilirubin, Urinalysis Negative Negative   Urobilinogen, Urinalysis 0.2 0.2 - 1.0 mg/dL   WBC, UA 0 <=5 /hpf   Red Blood Cells, Urinalysis 1 <=3 /hpf   Bacteria, Urinalysis 0-5 0 - 5 /hpf   Squamous Epithelial Cells, Urinalysis 4 /hpf  Pregnancy Test (HCG), Urine Qualitative  Result Value Ref Range   Pregnancy Test (HCG), Qual Urine Negative Negative  Wet Prep  Result Value Ref Range   Clue Cells, Vaginal None Seen None Seen   WBC (White Blood Cells), Vaginal Rare (!) None Seen   Trichomonas, Vaginal None Seen None Seen   Bacteria, Vaginal Few (!) None Seen   Yeast, Vaginal None Seen None Seen   Narrative   Whiff test negative       Assessment:     1. Acute UTI -     nitrofurantoin, macrocrystal-monohydrate, (MACROBID) 100 MG capsule; Take 1 capsule (100 mg total) by mouth 2 (two) times daily for 5 days  Dispense: 10 capsule; Refill: 0  2. Dysuria -     Urinalysis w/Microscopic -     Urine Culture, Routine - [facility] -     nitrofurantoin, macrocrystal-monohydrate, (MACROBID) 100 MG capsule; Take 1 capsule (100 mg total) by mouth 2 (two) times daily for 5 days  Dispense: 10 capsule; Refill: 0  3. Influenza vaccination declined by patient  4. Possible pregnancy -     Pregnancy Test (HCG), Urine Qualitative  5. Screen for sexually transmitted diseases -     Xpert CT/NG, PCR - [facility]  6. Vaginal discharge -     Wet Prep   Vaginal discharge likely physiologic unless CT/NG returns positive. Will f/u with results.    Plan:     Patient Instructions  You are being treated for a urinary tract infection and prescribed antibiotics, please take as directed.  Your urine is being sent for culture, if medication change is required, you will be notified.   Tylenol  as well as Motrin per package instructions for pain relief. Stay well hydrated.   Return if symptoms  worsen or persist despite treatment especially if you develop fever/chills, nausea/vomiting or low back pain as these may be signs of a kidney infection.   You will be notified once results of testing is available and appropriate treatment will be sent to your pharmacy, if needed.  Use a condom every time you have sex to reduce your risk of contracting a sexually transmitted infection.  Follow up if your symptoms worsen or do not improve.

## 2024-05-24 ENCOUNTER — Ambulatory Visit: Payer: Self-pay

## 2024-05-24 NOTE — Progress Notes (Deleted)
° °  Subjective:   This visit was conducted in person. The patient gave informed consent to the use of [employer] AI technology to record the contents of the encounter as documented below.   Patient ID: Afsheen Antony, female    DOB: 1990/08/23, 33 y.o.   MRN: [id number]   Moniqua Engebretsen is a very pleasant 33 y.o. female who presents today as a new patient.  Past medical, surgical and family history: Reviewed and updated in chart.  Allergies: Reviewed and updated in chart.  Medications: Reviewed and updated in chart.  Social history: Reviewed and updated in chart.  Last PCP and reason for leaving: ***  Discussed the use of AI scribe software for clinical note transcription with the patient, who gave verbal consent to proceed.  History of Present Illness        Review of Systems      There were no vitals taken for this visit.  Objective:     Physical Exam            Assessment & Plan:   Assessment and Plan Assessment & Plan      New patient, past medical and social history thoroughly reviewed and updated in chart.    No follow-ups on file.   Arina Torry K Hellon Vaccarella, MD  05/24/24    Contains text generated by [employer].
